# Patient Record
Sex: Male | Born: 1950 | Race: Black or African American | Hispanic: No | Marital: Single | Smoking: Current every day smoker
Health system: Southern US, Community
[De-identification: ages and names within clinical notes are randomized; demographics above are authoritative.]

## PROBLEM LIST (undated history)

## (undated) DIAGNOSIS — I10 Essential (primary) hypertension: Secondary | ICD-10-CM

## (undated) HISTORY — PX: OTHER SURGICAL HISTORY: SHX169

---

## 2006-01-17 ENCOUNTER — Ambulatory Visit: Payer: Self-pay

## 2007-04-09 IMAGING — CR RIGHT ANKLE - 2 VIEW
1 series · 2 of 2 positions shown · non-contrast
Comparison: none

REASON FOR EXAM: Arthritis (DR.MYLI DETUVIDA DDS [REDACTED] EXT 0600)
COMMENTS:

PROCEDURE:     DXR - DXR ANKLE RIGHT AP AND LATERAL  - January 17, 2006  [DATE]
RESULT:      AP and lateral views of the RIGHT ankle show no fracture,
dislocation or other acute bony abnormality.   The ankle mortise is well
maintained.

[Series 1: view not recorded · 0.17mm/px · 2 of 2 slices shown]
[im 1/2]
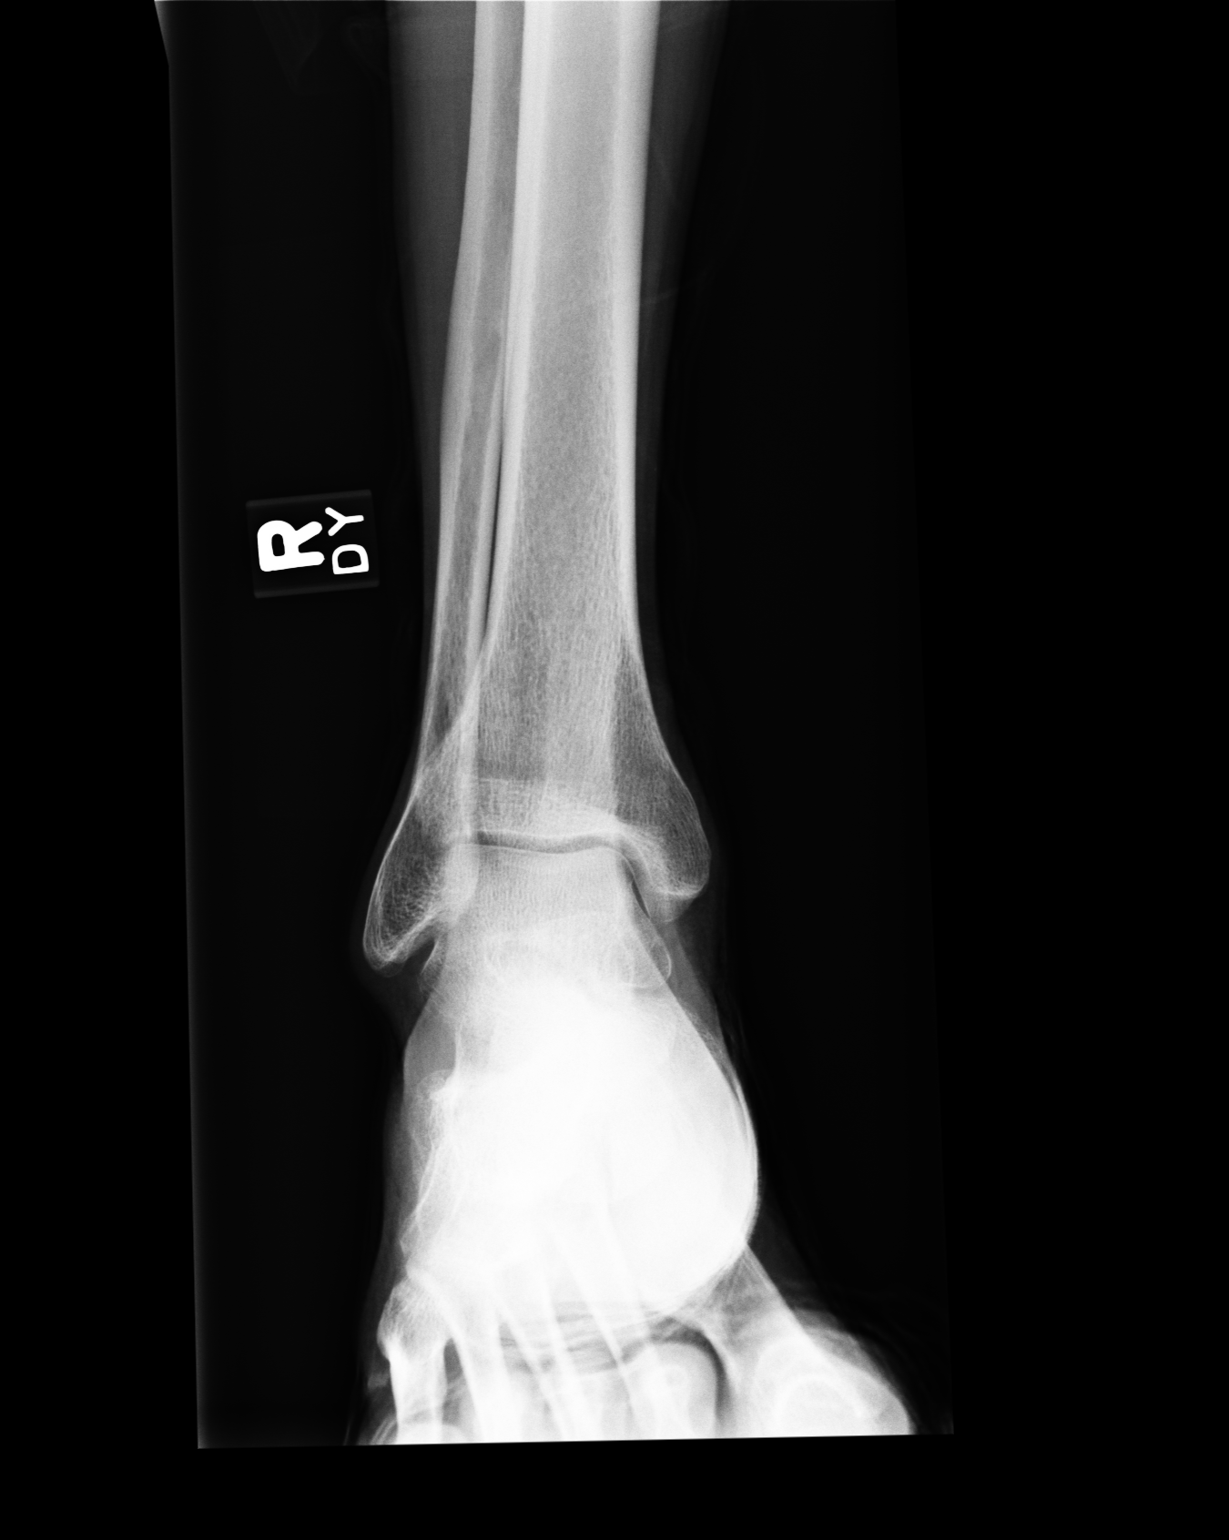
[im 2/2]
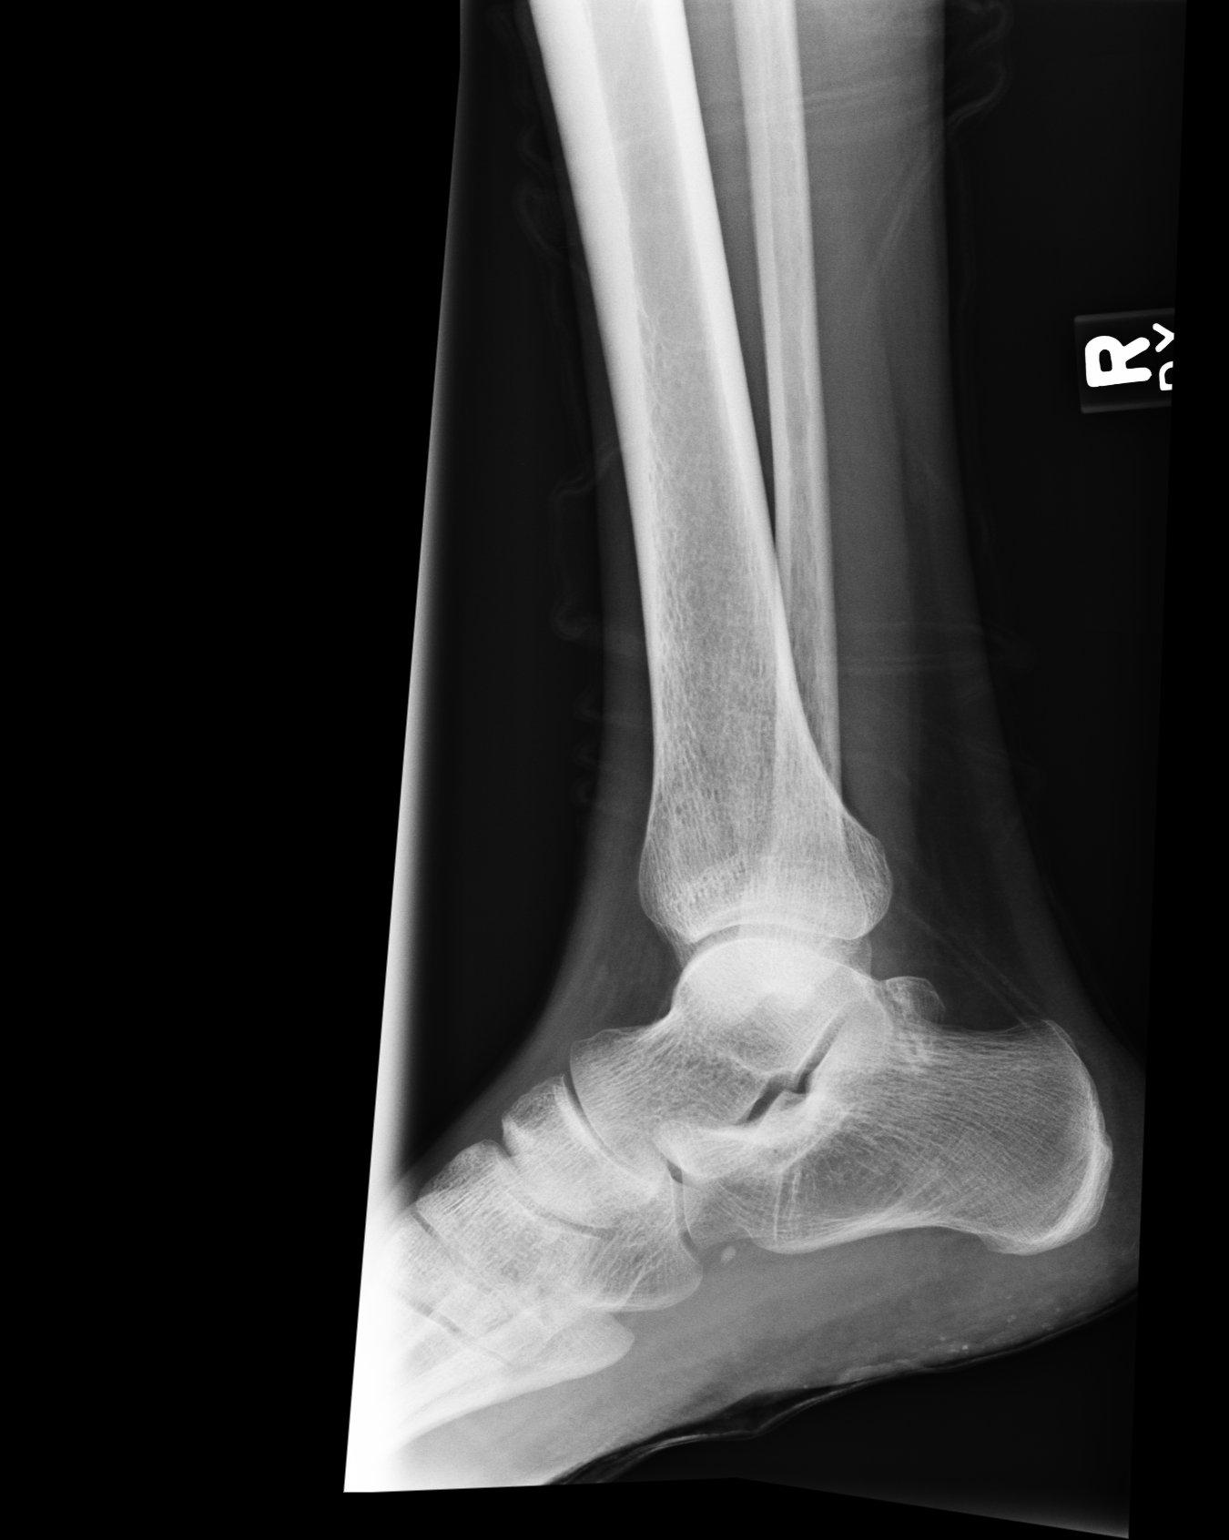

[2 of 2 positions shown; findings below may reference images not displayed]

IMPRESSION: No acute changes are identified.

## 2018-02-26 ENCOUNTER — Encounter (HOSPITAL_COMMUNITY): Payer: Self-pay | Admitting: *Deleted

## 2018-02-26 DIAGNOSIS — D649 Anemia, unspecified: Secondary | ICD-10-CM | POA: Insufficient documentation

## 2018-02-26 DIAGNOSIS — K922 Gastrointestinal hemorrhage, unspecified: Secondary | ICD-10-CM | POA: Diagnosis not present

## 2018-02-26 DIAGNOSIS — F17210 Nicotine dependence, cigarettes, uncomplicated: Secondary | ICD-10-CM | POA: Insufficient documentation

## 2018-02-26 DIAGNOSIS — I10 Essential (primary) hypertension: Secondary | ICD-10-CM | POA: Diagnosis not present

## 2018-02-26 DIAGNOSIS — K920 Hematemesis: Secondary | ICD-10-CM | POA: Diagnosis present

## 2018-02-26 NOTE — ED Triage Notes (Signed)
Pt with emesis of blood x 3 today, pt admits to ETOH of wine today. Pt will not tell triage nurse how much.  Pt denies pain.

## 2018-02-27 ENCOUNTER — Other Ambulatory Visit: Payer: Self-pay

## 2018-02-27 ENCOUNTER — Emergency Department (HOSPITAL_COMMUNITY)
Admission: EM | Admit: 2018-02-27 | Discharge: 2018-02-27 | Disposition: A | Discharge status: Home / Self Care | Payer: Medicare Other | Attending: Emergency Medicine | Admitting: Emergency Medicine

## 2018-02-27 DIAGNOSIS — D649 Anemia, unspecified: Secondary | ICD-10-CM

## 2018-02-27 DIAGNOSIS — K922 Gastrointestinal hemorrhage, unspecified: Secondary | ICD-10-CM | POA: Diagnosis not present

## 2018-02-27 HISTORY — DX: Essential (primary) hypertension: I10

## 2018-02-27 LAB — COMPREHENSIVE METABOLIC PANEL
ALBUMIN: 2.8 g/dL — AB (ref 3.5–5.0)
ALK PHOS: 119 U/L (ref 38–126)
ALT: 37 U/L (ref 17–63)
ANION GAP: 8 (ref 5–15)
AST: 115 U/L — ABNORMAL HIGH (ref 15–41)
BILIRUBIN TOTAL: 0.5 mg/dL (ref 0.3–1.2)
BUN: 11 mg/dL (ref 6–20)
CALCIUM: 8.5 mg/dL — AB (ref 8.9–10.3)
CO2: 28 mmol/L (ref 22–32)
CREATININE: 0.8 mg/dL (ref 0.61–1.24)
Chloride: 105 mmol/L (ref 101–111)
GFR calc non Af Amer: >60 mL/min (ref >60)
GLUCOSE: 102 mg/dL — AB (ref 65–99)
Potassium: 5.2 mmol/L — ABNORMAL HIGH (ref 3.5–5.1)
Sodium: 141 mmol/L (ref 135–145)
Total Protein: 7.1 g/dL (ref 6.5–8.1)

## 2018-02-27 LAB — CBC WITH DIFFERENTIAL/PLATELET
Basophils Absolute: 0.1 10*3/uL (ref 0.0–0.1)
Basophils Relative: 1 %
Eosinophils Absolute: 0.2 10*3/uL (ref 0.0–0.7)
Eosinophils Relative: 4 %
HEMATOCRIT: 25.9 % — AB (ref 39.0–52.0)
HEMOGLOBIN: 8.7 g/dL — AB (ref 13.0–17.0)
LYMPHS ABS: 2.1 10*3/uL (ref 0.7–4.0)
Lymphocytes Relative: 40 %
MCH: 32.8 pg (ref 26.0–34.0)
MCHC: 33.6 g/dL (ref 30.0–36.0)
MCV: 97.7 fL (ref 78.0–100.0)
MONOS PCT: 4 %
Monocytes Absolute: 0.2 10*3/uL (ref 0.1–1.0)
NEUTROS ABS: 2.6 10*3/uL (ref 1.7–7.7)
NEUTROS PCT: 51 %
Platelets: 252 10*3/uL (ref 150–400)
RBC: 2.65 MIL/uL — ABNORMAL LOW (ref 4.22–5.81)
RDW: 15.3 % (ref 11.5–15.5)
WBC: 5.2 10*3/uL (ref 4.0–10.5)

## 2018-02-27 LAB — TYPE AND SCREEN
ABO/RH(D): O POS
Antibody Screen: NEGATIVE

## 2018-02-27 LAB — HEMOGLOBIN AND HEMATOCRIT, BLOOD
HCT: 26.6 % — ABNORMAL LOW (ref 39.0–52.0)
HEMOGLOBIN: 8.8 g/dL — AB (ref 13.0–17.0)

## 2018-02-27 LAB — POC OCCULT BLOOD, ED: FECAL OCCULT BLD: POSITIVE — AB

## 2018-02-27 LAB — ETHANOL: Alcohol, Ethyl (B): 167 mg/dL — ABNORMAL HIGH (ref <10)

## 2018-02-27 MED ORDER — PANTOPRAZOLE SODIUM 40 MG PO TBEC: 40.0000 mg | DELAYED_RELEASE_TABLET | Freq: Every day | ORAL | 0 refills | Status: AC

## 2018-02-27 MED ORDER — PANTOPRAZOLE SODIUM 40 MG PO TBEC
40.0000 mg | DELAYED_RELEASE_TABLET | Freq: Once | ORAL | Status: AC
  Administered 2018-02-27: 40 mg via ORAL
  Filled 2018-02-27: qty 1
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
## 2018-02-27 NOTE — Discharge Instructions (Addendum)
Please see the gastroenterologist for further evaluation of your bleeding.  Do not drink anything with alcohol in it!  Return if you are having any problems.

## 2019-04-05 ENCOUNTER — Inpatient Hospital Stay
Admission: AD | Admit: 2019-04-05 | Payer: Medicare Other | Source: Other Acute Inpatient Hospital | Admitting: Specialist

## 2019-04-21 DIAGNOSIS — J9621 Acute and chronic respiratory failure with hypoxia: Secondary | ICD-10-CM

## 2019-04-21 DIAGNOSIS — J8410 Pulmonary fibrosis, unspecified: Secondary | ICD-10-CM | POA: Diagnosis not present

## 2019-04-21 DIAGNOSIS — D696 Thrombocytopenia, unspecified: Secondary | ICD-10-CM

## 2019-04-21 DIAGNOSIS — R4182 Altered mental status, unspecified: Secondary | ICD-10-CM

## 2019-04-21 DIAGNOSIS — J449 Chronic obstructive pulmonary disease, unspecified: Secondary | ICD-10-CM

## 2019-04-22 DIAGNOSIS — J9621 Acute and chronic respiratory failure with hypoxia: Secondary | ICD-10-CM

## 2019-04-22 DIAGNOSIS — J8410 Pulmonary fibrosis, unspecified: Secondary | ICD-10-CM | POA: Diagnosis not present

## 2019-04-22 DIAGNOSIS — D696 Thrombocytopenia, unspecified: Secondary | ICD-10-CM

## 2019-04-22 DIAGNOSIS — J449 Chronic obstructive pulmonary disease, unspecified: Secondary | ICD-10-CM

## 2019-04-22 DIAGNOSIS — R4182 Altered mental status, unspecified: Secondary | ICD-10-CM

## 2019-04-23 DIAGNOSIS — D696 Thrombocytopenia, unspecified: Secondary | ICD-10-CM

## 2019-04-23 DIAGNOSIS — J8410 Pulmonary fibrosis, unspecified: Secondary | ICD-10-CM | POA: Diagnosis not present

## 2019-04-23 DIAGNOSIS — R4182 Altered mental status, unspecified: Secondary | ICD-10-CM

## 2019-04-23 DIAGNOSIS — J9621 Acute and chronic respiratory failure with hypoxia: Secondary | ICD-10-CM | POA: Diagnosis not present

## 2019-04-23 DIAGNOSIS — J449 Chronic obstructive pulmonary disease, unspecified: Secondary | ICD-10-CM

## 2019-04-24 DIAGNOSIS — D696 Thrombocytopenia, unspecified: Secondary | ICD-10-CM

## 2019-04-24 DIAGNOSIS — R4182 Altered mental status, unspecified: Secondary | ICD-10-CM

## 2019-04-24 DIAGNOSIS — J8410 Pulmonary fibrosis, unspecified: Secondary | ICD-10-CM | POA: Diagnosis not present

## 2019-04-24 DIAGNOSIS — J9621 Acute and chronic respiratory failure with hypoxia: Secondary | ICD-10-CM | POA: Diagnosis not present

## 2019-04-24 DIAGNOSIS — J449 Chronic obstructive pulmonary disease, unspecified: Secondary | ICD-10-CM | POA: Diagnosis not present

## 2019-04-25 DIAGNOSIS — J8410 Pulmonary fibrosis, unspecified: Secondary | ICD-10-CM

## 2019-04-25 DIAGNOSIS — D696 Thrombocytopenia, unspecified: Secondary | ICD-10-CM

## 2019-04-25 DIAGNOSIS — J449 Chronic obstructive pulmonary disease, unspecified: Secondary | ICD-10-CM | POA: Diagnosis not present

## 2019-04-25 DIAGNOSIS — R4182 Altered mental status, unspecified: Secondary | ICD-10-CM

## 2019-04-25 DIAGNOSIS — J9621 Acute and chronic respiratory failure with hypoxia: Secondary | ICD-10-CM

## 2019-04-26 DIAGNOSIS — D696 Thrombocytopenia, unspecified: Secondary | ICD-10-CM

## 2019-04-26 DIAGNOSIS — J8410 Pulmonary fibrosis, unspecified: Secondary | ICD-10-CM

## 2019-04-26 DIAGNOSIS — J449 Chronic obstructive pulmonary disease, unspecified: Secondary | ICD-10-CM | POA: Diagnosis not present

## 2019-04-26 DIAGNOSIS — J9621 Acute and chronic respiratory failure with hypoxia: Secondary | ICD-10-CM

## 2019-04-26 DIAGNOSIS — R4182 Altered mental status, unspecified: Secondary | ICD-10-CM

## 2019-04-27 DIAGNOSIS — D696 Thrombocytopenia, unspecified: Secondary | ICD-10-CM

## 2019-04-27 DIAGNOSIS — R4182 Altered mental status, unspecified: Secondary | ICD-10-CM | POA: Diagnosis not present

## 2019-04-27 DIAGNOSIS — J8410 Pulmonary fibrosis, unspecified: Secondary | ICD-10-CM | POA: Diagnosis not present

## 2019-04-27 DIAGNOSIS — J9621 Acute and chronic respiratory failure with hypoxia: Secondary | ICD-10-CM

## 2019-04-27 DIAGNOSIS — J449 Chronic obstructive pulmonary disease, unspecified: Secondary | ICD-10-CM

## 2019-05-05 DIAGNOSIS — R4182 Altered mental status, unspecified: Secondary | ICD-10-CM | POA: Diagnosis not present

## 2019-05-05 DIAGNOSIS — D696 Thrombocytopenia, unspecified: Secondary | ICD-10-CM

## 2019-05-05 DIAGNOSIS — J449 Chronic obstructive pulmonary disease, unspecified: Secondary | ICD-10-CM | POA: Diagnosis not present

## 2019-05-05 DIAGNOSIS — J8410 Pulmonary fibrosis, unspecified: Secondary | ICD-10-CM | POA: Diagnosis not present

## 2019-05-05 DIAGNOSIS — J9621 Acute and chronic respiratory failure with hypoxia: Secondary | ICD-10-CM | POA: Diagnosis not present

## 2019-05-06 DIAGNOSIS — J449 Chronic obstructive pulmonary disease, unspecified: Secondary | ICD-10-CM | POA: Diagnosis not present

## 2019-05-06 DIAGNOSIS — D696 Thrombocytopenia, unspecified: Secondary | ICD-10-CM

## 2019-05-06 DIAGNOSIS — R4182 Altered mental status, unspecified: Secondary | ICD-10-CM | POA: Diagnosis not present

## 2019-05-06 DIAGNOSIS — J9621 Acute and chronic respiratory failure with hypoxia: Secondary | ICD-10-CM | POA: Diagnosis not present

## 2019-05-06 DIAGNOSIS — J8410 Pulmonary fibrosis, unspecified: Secondary | ICD-10-CM | POA: Diagnosis not present

## 2019-05-07 DIAGNOSIS — J9621 Acute and chronic respiratory failure with hypoxia: Secondary | ICD-10-CM | POA: Diagnosis not present

## 2019-05-07 DIAGNOSIS — D696 Thrombocytopenia, unspecified: Secondary | ICD-10-CM

## 2019-05-07 DIAGNOSIS — J8410 Pulmonary fibrosis, unspecified: Secondary | ICD-10-CM | POA: Diagnosis not present

## 2019-05-07 DIAGNOSIS — R4182 Altered mental status, unspecified: Secondary | ICD-10-CM | POA: Diagnosis not present

## 2019-05-07 DIAGNOSIS — J449 Chronic obstructive pulmonary disease, unspecified: Secondary | ICD-10-CM | POA: Diagnosis not present

## 2019-05-08 DIAGNOSIS — D696 Thrombocytopenia, unspecified: Secondary | ICD-10-CM

## 2019-05-08 DIAGNOSIS — R4182 Altered mental status, unspecified: Secondary | ICD-10-CM | POA: Diagnosis not present

## 2019-05-08 DIAGNOSIS — J9621 Acute and chronic respiratory failure with hypoxia: Secondary | ICD-10-CM | POA: Diagnosis not present

## 2019-05-08 DIAGNOSIS — J8410 Pulmonary fibrosis, unspecified: Secondary | ICD-10-CM | POA: Diagnosis not present

## 2019-05-08 DIAGNOSIS — J449 Chronic obstructive pulmonary disease, unspecified: Secondary | ICD-10-CM | POA: Diagnosis not present

## 2019-05-09 DIAGNOSIS — D696 Thrombocytopenia, unspecified: Secondary | ICD-10-CM

## 2019-05-09 DIAGNOSIS — J8410 Pulmonary fibrosis, unspecified: Secondary | ICD-10-CM | POA: Diagnosis not present

## 2019-05-09 DIAGNOSIS — R4182 Altered mental status, unspecified: Secondary | ICD-10-CM | POA: Diagnosis not present

## 2019-05-09 DIAGNOSIS — J9621 Acute and chronic respiratory failure with hypoxia: Secondary | ICD-10-CM | POA: Diagnosis not present

## 2019-05-09 DIAGNOSIS — J449 Chronic obstructive pulmonary disease, unspecified: Secondary | ICD-10-CM | POA: Diagnosis not present

## 2019-05-10 DIAGNOSIS — R4182 Altered mental status, unspecified: Secondary | ICD-10-CM | POA: Diagnosis not present

## 2019-05-10 DIAGNOSIS — J449 Chronic obstructive pulmonary disease, unspecified: Secondary | ICD-10-CM | POA: Diagnosis not present

## 2019-05-10 DIAGNOSIS — J9621 Acute and chronic respiratory failure with hypoxia: Secondary | ICD-10-CM | POA: Diagnosis not present

## 2019-05-10 DIAGNOSIS — D696 Thrombocytopenia, unspecified: Secondary | ICD-10-CM

## 2019-05-10 DIAGNOSIS — J8410 Pulmonary fibrosis, unspecified: Secondary | ICD-10-CM | POA: Diagnosis not present

## 2019-05-11 DIAGNOSIS — J9621 Acute and chronic respiratory failure with hypoxia: Secondary | ICD-10-CM | POA: Diagnosis not present

## 2019-05-11 DIAGNOSIS — J8410 Pulmonary fibrosis, unspecified: Secondary | ICD-10-CM | POA: Diagnosis not present

## 2019-05-11 DIAGNOSIS — D696 Thrombocytopenia, unspecified: Secondary | ICD-10-CM

## 2019-05-11 DIAGNOSIS — J449 Chronic obstructive pulmonary disease, unspecified: Secondary | ICD-10-CM | POA: Diagnosis not present

## 2019-05-11 DIAGNOSIS — R4182 Altered mental status, unspecified: Secondary | ICD-10-CM | POA: Diagnosis not present

## 2019-06-03 DEATH — deceased
